# Patient Record
Sex: Female | Born: 1985 | Race: White | Hispanic: No | Marital: Single | Smoking: Never smoker
Health system: Southern US, Community
[De-identification: ages and names within clinical notes are randomized; demographics above are authoritative.]

## PROBLEM LIST (undated history)

## (undated) HISTORY — PX: KNEE SURGERY: SHX244

---
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
## 2011-07-07 ENCOUNTER — Ambulatory Visit: Payer: BC Managed Care – PPO

## 2011-07-07 ENCOUNTER — Ambulatory Visit (INDEPENDENT_AMBULATORY_CARE_PROVIDER_SITE_OTHER): Payer: BC Managed Care – PPO | Admitting: Family Medicine

## 2011-07-07 ENCOUNTER — Encounter: Payer: Self-pay | Admitting: Family Medicine

## 2011-07-07 VITALS — BP 119/88 | HR 90 | Temp 97.9°F | Resp 20 | Ht 61.0 in | Wt 168.0 lb

## 2011-07-07 DIAGNOSIS — M25519 Pain in unspecified shoulder: Secondary | ICD-10-CM

## 2011-07-07 DIAGNOSIS — S43006A Unspecified dislocation of unspecified shoulder joint, initial encounter: Secondary | ICD-10-CM

## 2011-07-07 MED ORDER — HYDROCODONE-ACETAMINOPHEN 5-325 MG PO TABS: 1.0000 | ORAL_TABLET | Freq: Four times a day (QID) | ORAL | Status: AC | PRN

## 2011-07-07 NOTE — Progress Notes (Signed)
  Subjective:    Patient ID: Shelia Ashley, female    DOB: October 31, 1985, 26 y.o.   MRN: [id number]  HPI Shelia Ashley is a 26 y.o. female R shoulder dislocated - 30 minutes ago. - playing volleyball in pool.  Hx of dislocated R  shoulder - last episode 3 years ago  - 2nd episode then - seen in [place], [place].   R hand dominant.  TX: none  Review of Systems Pain in R shoulder - radiating down arm.  No neck pain, but hurts above shoulder.     Objective:   Physical Exam  Constitutional: She is oriented to person, place, and time. She appears well-developed. She appears distressed.       Appears uncomfortable.    HENT:  Head: Normocephalic and atraumatic.  Pulmonary/Chest: Effort normal.  Musculoskeletal:       Right shoulder: She exhibits decreased range of motion and tenderness.       Cervical back: She exhibits normal range of motion, no tenderness and no bony tenderness.       Arms: Neurological: She is alert and oriented to person, place, and time.       NVI distally before attempt at reduction.   Skin: Skin is warm and dry.  Psychiatric: She has a normal mood and affect.    10ml of lortab elixir 7.5/15ml given at 1803  1815 - Stimpson maneuver with pt in prone position - distal traction applied - relocated within 5 minutes, NVI distally after relocation, with pain improved.  [facility] reading (PRIMARY) by  Dr. Neva Seat:  R shoulder: initial: glenohumeral dislocation - anterior.  Postreduction: reduced.  Possible faint inferior glenoid opacity - ? bankart lesion.      Assessment & Plan:  Shelia Ashley is a 26 y.o. female  1. Pain in shoulder  DG Shoulder Right, DG Shoulder Right, HYDROcodone-acetaminophen (NORCO) 5-325 MG per tablet, Ambulatory referral to Orthopedic Surgery  2. Dislocation, shoulder closed  DG Shoulder Right, DG Shoulder Right, HYDROcodone-acetaminophen (NORCO) 5-325 MG per tablet, Ambulatory referral to Orthopedic Surgery   Reduced as above.  Possible inferior  glenoid lesion/bankart injury  - subtle finding on Xray.  Sling/swath, note for work, and Lortab q6h prn pain - SED.  Refer to Dr. Rennis Chris at [facility] this week if possible. RTC/ER precautions.

## 2012-11-06 IMAGING — CR DG SHOULDER 2+V*R*
1 series · 1 of 1 positions shown · non-contrast
Comparison: 07/07/2011

CLINICAL DATA: Postreduction.

RIGHT SHOULDER - 2+ VIEW

[AP]
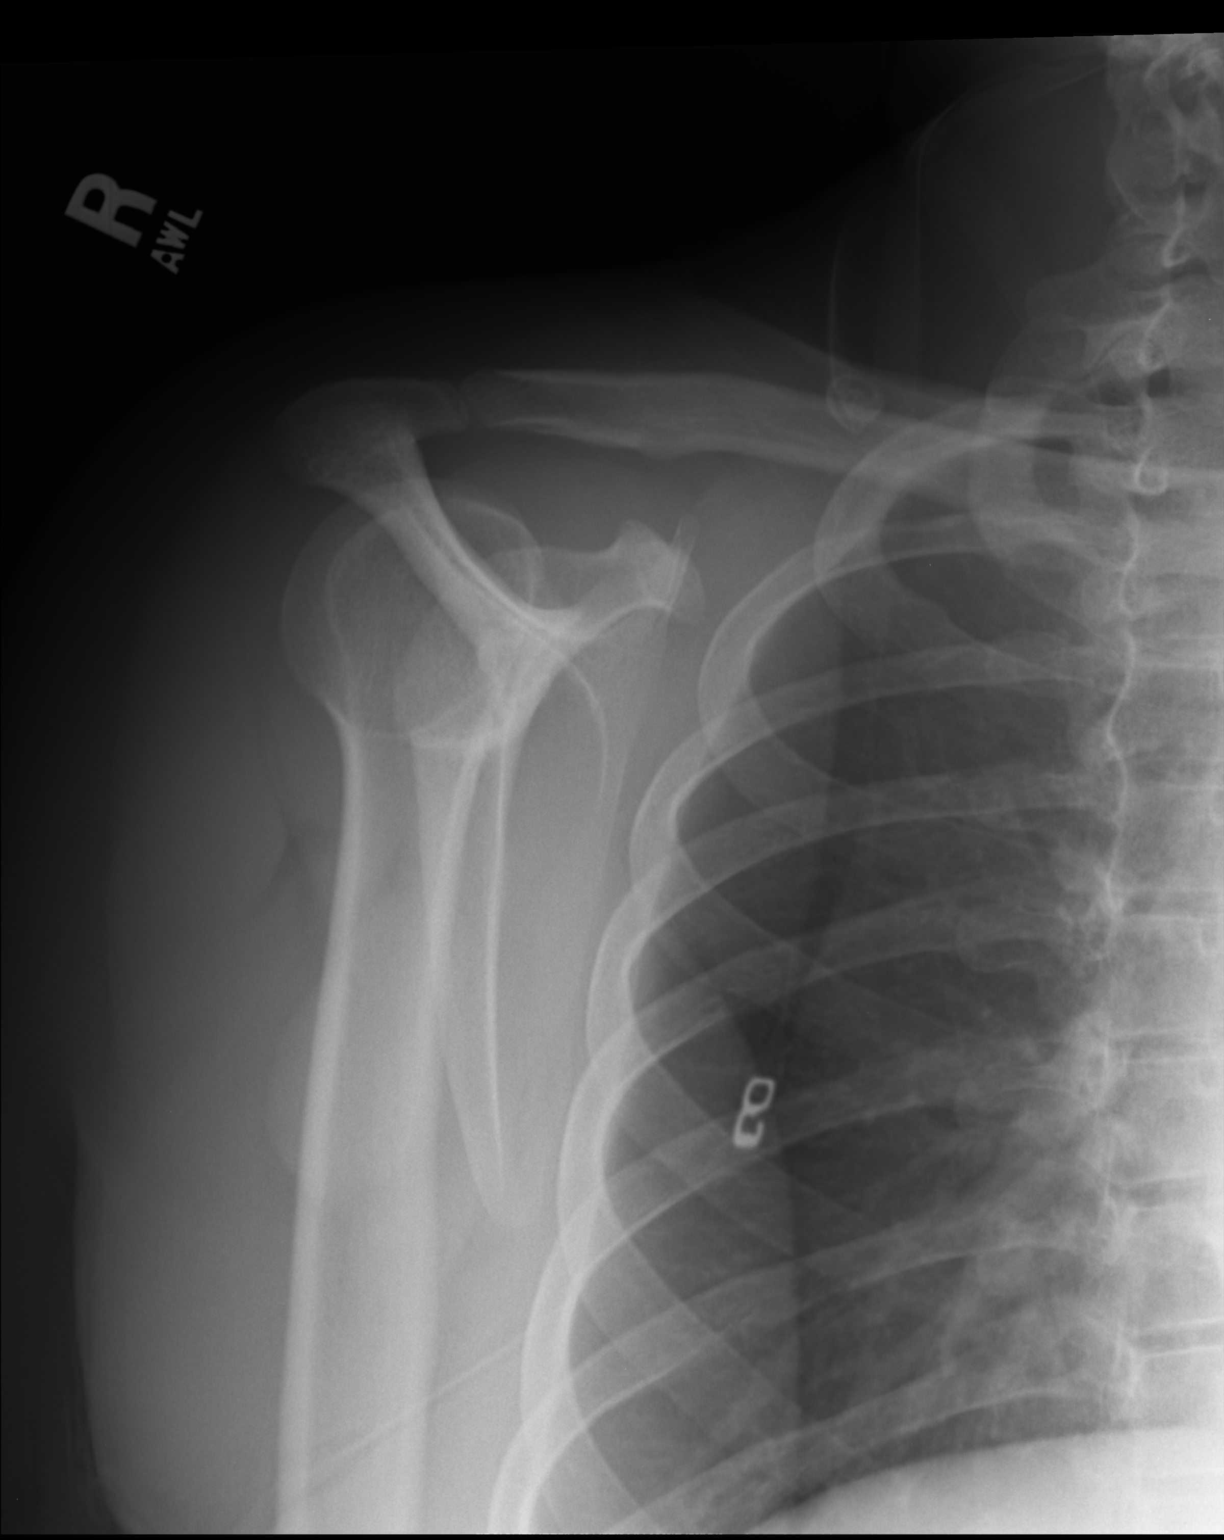

[1 of 1 positions shown; findings below may reference images not displayed]

FINDINGS: Interval reduction of the dislocated right shoulder.  No
visible fracture.  Normal alignment currently.
IMPRESSION: Interval reduction.  No visible fracture.

Clinically significant discrepancy from primary report, if
provided: None

## 2013-10-07 ENCOUNTER — Encounter: Payer: Self-pay | Admitting: Nurse Practitioner

## 2013-10-07 ENCOUNTER — Ambulatory Visit (INDEPENDENT_AMBULATORY_CARE_PROVIDER_SITE_OTHER): Payer: BC Managed Care – PPO | Admitting: Nurse Practitioner

## 2013-10-07 VITALS — BP 101/71 | HR 77 | Temp 98.1°F | Resp 18 | Ht 62.5 in | Wt 149.0 lb

## 2013-10-07 DIAGNOSIS — H8110 Benign paroxysmal vertigo, unspecified ear: Secondary | ICD-10-CM | POA: Insufficient documentation

## 2013-10-07 DIAGNOSIS — H8111 Benign paroxysmal vertigo, right ear: Secondary | ICD-10-CM

## 2013-10-07 NOTE — Patient Instructions (Signed)
If dizziness returns, repeat Epley's maneuver- refer to instructions or view you tube videos -they should be by physical therapists. If you are not getting complete relief, call my office & I will refer you to a physical therapist that specializes in vestibular therapy.  Schedule physical when you can fast at your convenience.  Nice to meet you!  Benign Positional Vertigo Vertigo means you feel like you or your surroundings are moving when they are not. Benign positional vertigo is the most common form of vertigo. Benign means that the cause of your condition is not serious. Benign positional vertigo is more common in older adults. CAUSES  Benign positional vertigo is the result of an upset in the labyrinth system. This is an area in the middle ear that helps control your balance. This may be caused by a viral infection, head injury, or repetitive motion. However, often no specific cause is found. SYMPTOMS  Symptoms of benign positional vertigo occur when you move your head or eyes in different directions. Some of the symptoms may include:  Loss of balance and falls.  Vomiting.  Blurred vision.  Dizziness.  Nausea.  Involuntary eye movements (nystagmus). DIAGNOSIS  Benign positional vertigo is usually diagnosed by physical exam. If the specific cause of your benign positional vertigo is unknown, your caregiver may perform imaging tests, such as magnetic resonance imaging (MRI) or computed tomography (CT). TREATMENT  Your caregiver may recommend movements or procedures to correct the benign positional vertigo. Medicines such as meclizine, benzodiazepines, and medicines for nausea may be used to treat your symptoms. In rare cases, if your symptoms are caused by certain conditions that affect the inner ear, you may need surgery. HOME CARE INSTRUCTIONS   Follow your caregiver's instructions.  Move slowly. Do not make sudden body or head movements.  Avoid driving.  Avoid operating  heavy machinery.  Avoid performing any tasks that would be dangerous to you or others during a vertigo episode.  Drink enough fluids to keep your urine clear or pale yellow. SEEK IMMEDIATE MEDICAL CARE IF:   You develop problems with walking, weakness, numbness, or using your arms, hands, or legs.  You have difficulty speaking.  You develop severe headaches.  Your nausea or vomiting continues or gets worse.  You develop visual changes.  Your family or friends notice any behavioral changes.  Your condition gets worse.  You have a fever.  You develop a stiff neck or sensitivity to light. MAKE SURE YOU:   Understand these instructions.  Will watch your condition.  Will get help right away if you are not doing well or get worse. Document Released: 10/22/2005 Document Revised: 04/08/2011 Document Reviewed: 10/04/2010 [facility] Patient Information 2015 [place], [place]. This information is not intended to replace advice given to you by your health care provider. Make sure you discuss any questions you have with your health care provider.   Epley Maneuver Self-Care WHAT IS THE EPLEY MANEUVER? The Epley maneuver is an exercise you can do to relieve symptoms of benign paroxysmal positional vertigo (BPPV). This condition is often just referred to as vertigo. BPPV is caused by the movement of tiny crystals (canaliths) inside your inner ear. The accumulation and movement of canaliths in your inner ear causes a sudden spinning sensation (vertigo) when you move your head to certain positions. Vertigo usually lasts about 30 seconds. BPPV usually occurs in just one ear. If you get vertigo when you lie on your left side, you probably have BPPV in your left ear.  Your health care provider can tell you which ear is involved.  BPPV may be caused by a head injury. Many people older than 50 get BPPV for unknown reasons. If you have been diagnosed with BPPV, your health care provider may teach you how to  do this maneuver. BPPV is not life threatening (benign) and usually goes away in time.  WHEN SHOULD I PERFORM THE EPLEY MANEUVER? You can do this maneuver at home whenever you have symptoms of vertigo. You may do the Epley maneuver up to 3 times a day until your symptoms of vertigo go away. HOW SHOULD I DO THE EPLEY MANEUVER? 1. Sit on the edge of a bed or table with your back straight. Your legs should be extended or hanging over the edge of the bed or table.  2. Turn your head halfway toward the affected ear.  3. Lie backward quickly with your head turned until you are lying flat on your back. You may want to position a pillow under your shoulders.  4. Hold this position for 30 seconds. You may experience an attack of vertigo. This is normal. Hold this position until the vertigo stops. 5. Then turn your head to the opposite direction until your unaffected ear is facing the floor.  6. Hold this position for 30 seconds. You may experience an attack of vertigo. This is normal. Hold this position until the vertigo stops. 7. Now turn your whole body to the same side as your head. Hold for another 30 seconds.  8. You can then sit back up. ARE THERE RISKS TO THIS MANEUVER? In some cases, you may have other symptoms (such as changes in your vision, weakness, or numbness). If you have these symptoms, stop doing the maneuver and call your health care provider. Even if doing these maneuvers relieves your vertigo, you may still have dizziness. Dizziness is the sensation of light-headedness but without the sensation of movement. Even though the Epley maneuver may relieve your vertigo, it is possible that your symptoms will return within 5 years. WHAT SHOULD I DO AFTER THIS MANEUVER? After doing the Epley maneuver, you can return to your normal activities. Ask your doctor if there is anything you should do at home to prevent vertigo. This may include:  Sleeping with two or more pillows to keep your head  elevated.  Not sleeping on the side of your affected ear.  Getting up slowly from bed.  Avoiding sudden movements during the day.  Avoiding extreme head movement, like looking up or bending over.  Wearing a cervical collar to prevent sudden head movements. WHAT SHOULD I DO IF MY SYMPTOMS GET WORSE? Call your health care provider if your vertigo gets worse. Call your provider right way if you have other symptoms, including:   Nausea.  Vomiting.  Headache.  Weakness.  Numbness.  Vision changes. Document Released: 01/19/2013 Document Reviewed: 01/19/2013 [facility] Patient Information 2015 [place], [place]. This information is not intended to replace advice given to you by your health care provider. Make sure you discuss any questions you have with your health care provider.

## 2013-10-07 NOTE — Progress Notes (Signed)
Pre visit review using our clinic review tool, if applicable. No additional management support is needed unless otherwise documented below in the visit note. 

## 2013-10-07 NOTE — Progress Notes (Signed)
   Subjective:    Patient ID: Shelia Ashley, female    DOB: Aug 29, 1985, 28 y.o.   MRN: [id number]  HPI Comments: Shelia Ashley presents to establish care & c/o vertigo. Gynecology care is performed at [facility].  Dizziness This is a new problem. The current episode started in the past 7 days (2 da). The problem occurs intermittently (worse when rolls over in bed to R side, or quickly turns head to leftt). The problem has been waxing and waning. Associated symptoms include vertigo. Pertinent negatives include no abdominal pain, anorexia, chills, congestion, coughing, fever, headaches, nausea, sore throat, visual change or vomiting. Exacerbated by: turn head to R, or rollover in bed to R. She has tried nothing for the symptoms.      Review of Systems  Constitutional: Negative for fever and chills.  HENT: Negative for congestion and sore throat.   Respiratory: Negative for cough.   Gastrointestinal: Negative for nausea, vomiting, abdominal pain and anorexia.  Neurological: Positive for dizziness and vertigo. Negative for headaches.       Objective:   Physical Exam  Vitals reviewed. Constitutional: She is oriented to person, place, and time. She appears well-developed and well-nourished. No distress.  HENT:  Head: Normocephalic and atraumatic.  Eyes: Conjunctivae and EOM are normal. Pupils are equal, round, and reactive to light. Right eye exhibits no discharge. Left eye exhibits no discharge.  Cardiovascular: Normal rate.   Pulmonary/Chest: Effort normal.  Neurological: She is alert and oriented to person, place, and time.  Dix-Halpike maneuver: nml on L, increased dizziness, mild nystagmus to R. Performed Epley's maneuver in office. Symptoms resolved.   Skin: Skin is warm and dry.  Psychiatric: She has a normal mood and affect. Her behavior is normal. Thought content normal.          Assessment & Plan:  1. BPPV (benign paroxysmal positional vertigo), right Symptoms  resolved in ofc w/Epley's. Repeat epley's if symptoms return. Call ofc for PT referral for vestibular therapy if no resolve. F/u PRN. Declines flu today Will schedule fasting CPE at pt convenience.

## 2013-11-29 ENCOUNTER — Encounter: Payer: Self-pay | Admitting: Nurse Practitioner

## 2013-11-29 DIAGNOSIS — Z0000 Encounter for general adult medical examination without abnormal findings: Secondary | ICD-10-CM | POA: Insufficient documentation

## 2013-11-30 ENCOUNTER — Other Ambulatory Visit: Payer: Self-pay | Admitting: Nurse Practitioner

## 2016-12-21 ENCOUNTER — Encounter: Payer: Self-pay | Admitting: Pediatrics

## 2016-12-21 ENCOUNTER — Ambulatory Visit (INDEPENDENT_AMBULATORY_CARE_PROVIDER_SITE_OTHER): Payer: Commercial Managed Care - PPO | Admitting: Pediatrics

## 2016-12-21 VITALS — BP 108/75 | HR 105 | Temp 99.3°F | Ht 62.0 in | Wt 215.6 lb

## 2016-12-21 DIAGNOSIS — J0100 Acute maxillary sinusitis, unspecified: Secondary | ICD-10-CM | POA: Diagnosis not present

## 2016-12-21 DIAGNOSIS — J029 Acute pharyngitis, unspecified: Secondary | ICD-10-CM | POA: Diagnosis not present

## 2016-12-21 MED ORDER — AMOXICILLIN-POT CLAVULANATE 875-125 MG PO TABS: 1.0000 | ORAL_TABLET | Freq: Two times a day (BID) | ORAL | 0 refills | Status: AC

## 2016-12-21 NOTE — Progress Notes (Signed)
  Subjective:   Patient ID: Shelia FlaxMatty J Luis, female    DOB: Jan 20, 1986, 31 y.o.   MRN: [id number] CC: Sore Throat and Ear Pain  HPI: Shelia Ashley is a 31 y.o. female presenting for Sore Throat and Ear Pain  Sore throat started this morning Had some drainage starting yesterday Took OTC sinus medicine yesterday For past two weeks has had some sinus drainage Has gotten worse over the past few days Now with thick blood streaked discharge Pain in her face when she leans forward Subjective fevers Throat bothering her the most now, ongoing past few days  PMH: none  Fam hx: non-contributory  Social History   Socioeconomic History  . Marital status: Single    Spouse name: None  . Number of children: 0  . Years of education: None  . Highest education level: None  Social Needs  . Financial resource strain: None  . Food insecurity - worry: None  . Food insecurity - inability: None  . Transportation needs - medical: None  . Transportation needs - non-medical: None  Occupational History    Employer: [employer]  Tobacco Use  . Smoking status: Never Smoker  . Smokeless tobacco: Never Used  Substance and Sexual Activity  . Alcohol use: No    Comment: Socially  . Drug use: No  . Sexual activity: Yes    Birth control/protection: Pill  Other Topics Concern  . None  Social History Narrative   Ms. Madelin RearDillon lives alone. 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   ROS: All systems negative other than what is in HPI  Objective:    BP 108/75   Pulse (!) 105   Temp 99.3 F (37.4 C) (Oral)   Ht 5\' 2"  (1.575 m)   Wt 215 lb 9.6 oz (97.8 kg)   BMI 39.43 kg/m   Wt Readings from Last 3 Encounters:  12/21/16 215 lb 9.6 oz (97.8 kg)  10/07/13 149 lb (67.6 kg)  07/07/11 168 lb (76.2 kg)    Gen: NAD, alert, cooperative with exam, NCAT EYES: EOMI, no conjunctival injection, or no icterus ENT:  TMs dull gray b/l, OP with mild erythema, ttp over sinuses b/l LYMPH: no cervical  LAD CV: NRRR, normal S1/S2, no murmur, distal pulses 2+ b/l Resp: CTABL, no wheezes, normal WOB Abd: +BS, soft, NTND. no guarding or organomegaly Ext: No edema, warm Neuro: Alert and oriented, strength equal b/l UE and LE, coordination grossly normal MSK: normal muscle bulk  Assessment & Plan:  Jamelle was seen today for sore throat and ear pain.  Diagnoses and all orders for this visit:  Acute non-recurrent maxillary sinusitis Start below Discussed sinus rinses -     amoxicillin-clavulanate (AUGMENTIN) 875-125 MG tablet; Take 1 tablet by mouth 2 (two) times daily.  Sore throat Rapid neg -     Rapid Strep Screen (Not at [facility])  Follow up plan: Return if symptoms worsen or fail to improve. Rex Krasarol Yazan Gatling, MD [facility]

## 2016-12-21 NOTE — Patient Instructions (Signed)
Nasal congestion: Netipot with distilled water 2-3 times a day to clear out sinuses  Or Normal saline nasal spray Flonase steroid nasal spray  For sore throat can use: Ibuprofen 400- 600mg three times a day Throat lozenges chloroseptic spray  Stick with bland foods Drink lots of fluids  

## 2016-12-23 ENCOUNTER — Telehealth: Payer: Self-pay | Admitting: Nurse Practitioner

## 2016-12-23 LAB — CULTURE, GROUP A STREP

## 2016-12-23 LAB — RAPID STREP SCREEN (MED CTR MEBANE ONLY): Strep Gp A Ag, IA W/Reflex: NEGATIVE

## 2016-12-23 NOTE — Telephone Encounter (Signed)
Pt is aware of MD feedback and will see someone in [place].

## 2016-12-23 NOTE — Telephone Encounter (Signed)
She needs to be seen, here or in [place] if she is back there, evaluate for wheezing or pneumonia. Can't know what medicine would help without an exam. She was treated for sinusitis with augmentin two days ago when here, should keep taking the augmentin.

## 2016-12-24 LAB — CULTURE, GROUP A STREP
